# Patient Record
Sex: Male | Born: 2000 | Race: Black or African American | Hispanic: No | Marital: Single | Smoking: Never smoker
Health system: Southern US, Community
[De-identification: ages and names within clinical notes are randomized; demographics above are authoritative.]

## PROBLEM LIST (undated history)

## (undated) DIAGNOSIS — Q431 Hirschsprung's disease: Secondary | ICD-10-CM

## (undated) DIAGNOSIS — E739 Lactose intolerance, unspecified: Secondary | ICD-10-CM

## (undated) DIAGNOSIS — K5900 Constipation, unspecified: Secondary | ICD-10-CM

## (undated) DIAGNOSIS — J40 Bronchitis, not specified as acute or chronic: Secondary | ICD-10-CM

## (undated) HISTORY — PX: HERNIA REPAIR: SHX51

## (undated) HISTORY — PX: LAPAROSCOPIC ENDO-RECTAL PULL THROUGH FOR HIRSCHSPRUNG'S DISEASE: SHX1923

---
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
## 2019-10-10 ENCOUNTER — Other Ambulatory Visit: Payer: Self-pay

## 2019-10-10 ENCOUNTER — Encounter (HOSPITAL_COMMUNITY): Payer: Self-pay | Admitting: Emergency Medicine

## 2019-10-10 ENCOUNTER — Emergency Department (HOSPITAL_COMMUNITY)
Admission: EM | Admit: 2019-10-10 | Discharge: 2019-10-11 | Disposition: A | Discharge status: Home / Self Care | Payer: Medicaid Other | Attending: Emergency Medicine | Admitting: Emergency Medicine

## 2019-10-10 DIAGNOSIS — R1110 Vomiting, unspecified: Secondary | ICD-10-CM | POA: Diagnosis not present

## 2019-10-10 DIAGNOSIS — R1033 Periumbilical pain: Secondary | ICD-10-CM | POA: Diagnosis not present

## 2019-10-10 DIAGNOSIS — Z5321 Procedure and treatment not carried out due to patient leaving prior to being seen by health care provider: Secondary | ICD-10-CM | POA: Diagnosis not present

## 2019-10-10 LAB — COMPREHENSIVE METABOLIC PANEL
ALT: 13 U/L (ref 0–44)
AST: 18 U/L (ref 15–41)
Albumin: 4.4 g/dL (ref 3.5–5.0)
Alkaline Phosphatase: 43 U/L (ref 38–126)
Anion gap: 12 (ref 5–15)
BUN: 12 mg/dL (ref 6–20)
CO2: 25 mmol/L (ref 22–32)
Calcium: 9.1 mg/dL (ref 8.9–10.3)
Chloride: 103 mmol/L (ref 98–111)
Creatinine, Ser: 1.02 mg/dL (ref 0.61–1.24)
GFR calc Af Amer: >60 mL/min (ref >60)
GFR calc non Af Amer: >60 mL/min (ref >60)
Glucose, Bld: 99 mg/dL (ref 70–99)
Potassium: 3.2 mmol/L — ABNORMAL LOW (ref 3.5–5.1)
Sodium: 140 mmol/L (ref 135–145)
Total Bilirubin: 1.2 mg/dL (ref 0.3–1.2)
Total Protein: 7.1 g/dL (ref 6.5–8.1)

## 2019-10-10 LAB — CBC
HCT: 43.6 % (ref 39.0–52.0)
Hemoglobin: 13.5 g/dL (ref 13.0–17.0)
MCH: 24.5 pg — ABNORMAL LOW (ref 26.0–34.0)
MCHC: 31 g/dL (ref 30.0–36.0)
MCV: 79.3 fL — ABNORMAL LOW (ref 80.0–100.0)
Platelets: 316 10*3/uL (ref 150–400)
RBC: 5.5 MIL/uL (ref 4.22–5.81)
RDW: 15.9 % — ABNORMAL HIGH (ref 11.5–15.5)
WBC: 8.2 10*3/uL (ref 4.0–10.5)
nRBC: 0 % (ref 0.0–0.2)

## 2019-10-10 LAB — LIPASE, BLOOD: Lipase: 20 U/L (ref 11–51)

## 2019-10-10 MED ORDER — SODIUM CHLORIDE 0.9% FLUSH: 3.0000 mL | Freq: Once | INTRAVENOUS | Status: DC

## 2019-10-10 NOTE — ED Triage Notes (Signed)
Patient reports mid abdominal pain with emesis onset yesterday , no diarrhea or fever .

## 2019-10-10 NOTE — ED Notes (Signed)
Unable to find pt in waiting room to update VS.

## 2019-10-11 NOTE — ED Notes (Signed)
Pt was called for information 3x and no response. Pt was called for VS 3x and no response. Pt was also called by the back with no response. Pt appears to have left AMA and is no longer in the lobby.

## 2019-10-11 NOTE — ED Notes (Signed)
Pts name was called 3x for VS and no response.

## 2019-11-27 ENCOUNTER — Emergency Department (HOSPITAL_COMMUNITY)
Admission: EM | Admit: 2019-11-27 | Discharge: 2019-11-27 | Discharge status: Against Medical Advice | Payer: Medicaid Other

## 2019-12-01 ENCOUNTER — Emergency Department (HOSPITAL_COMMUNITY)
Admission: EM | Admit: 2019-12-01 | Discharge: 2019-12-02 | Disposition: A | Discharge status: Home / Self Care | Payer: Medicaid Other | Attending: Emergency Medicine | Admitting: Emergency Medicine

## 2019-12-01 ENCOUNTER — Other Ambulatory Visit: Payer: Self-pay

## 2019-12-01 DIAGNOSIS — Z5321 Procedure and treatment not carried out due to patient leaving prior to being seen by health care provider: Secondary | ICD-10-CM | POA: Insufficient documentation

## 2019-12-01 DIAGNOSIS — R6889 Other general symptoms and signs: Secondary | ICD-10-CM | POA: Diagnosis not present

## 2019-12-01 NOTE — ED Notes (Signed)
Called pt for triage, no answer x 1.

## 2019-12-01 NOTE — ED Notes (Signed)
Pt called for triage, no answer x2 

## 2019-12-01 NOTE — ED Notes (Signed)
Called pt for triage, no answer x3 °

## 2021-01-26 ENCOUNTER — Encounter (HOSPITAL_COMMUNITY): Payer: Self-pay | Admitting: Emergency Medicine

## 2021-01-26 ENCOUNTER — Emergency Department (HOSPITAL_COMMUNITY): Payer: Medicaid Other

## 2021-01-26 ENCOUNTER — Other Ambulatory Visit: Payer: Self-pay

## 2021-01-26 ENCOUNTER — Emergency Department (HOSPITAL_COMMUNITY)
Admission: EM | Admit: 2021-01-26 | Discharge: 2021-01-27 | Disposition: A | Discharge status: Home / Self Care | Payer: Medicaid Other | Attending: Emergency Medicine | Admitting: Emergency Medicine

## 2021-01-26 DIAGNOSIS — R1012 Left upper quadrant pain: Secondary | ICD-10-CM | POA: Diagnosis present

## 2021-01-26 DIAGNOSIS — J45909 Unspecified asthma, uncomplicated: Secondary | ICD-10-CM | POA: Diagnosis not present

## 2021-01-26 DIAGNOSIS — F17200 Nicotine dependence, unspecified, uncomplicated: Secondary | ICD-10-CM | POA: Diagnosis not present

## 2021-01-26 DIAGNOSIS — K5909 Other constipation: Secondary | ICD-10-CM | POA: Diagnosis not present

## 2021-01-26 DIAGNOSIS — R112 Nausea with vomiting, unspecified: Secondary | ICD-10-CM | POA: Insufficient documentation

## 2021-01-26 LAB — CBC
HCT: 48.2 % (ref 39.0–52.0)
Hemoglobin: 16.3 g/dL (ref 13.0–17.0)
MCH: 28.1 pg (ref 26.0–34.0)
MCHC: 33.8 g/dL (ref 30.0–36.0)
MCV: 83.1 fL (ref 80.0–100.0)
Platelets: 290 10*3/uL (ref 150–400)
RBC: 5.8 MIL/uL (ref 4.22–5.81)
RDW: 15.1 % (ref 11.5–15.5)
WBC: 10.4 10*3/uL (ref 4.0–10.5)
nRBC: 0 % (ref 0.0–0.2)

## 2021-01-26 LAB — COMPREHENSIVE METABOLIC PANEL
ALT: 13 U/L (ref 0–44)
AST: 18 U/L (ref 15–41)
Albumin: 4.9 g/dL (ref 3.5–5.0)
Alkaline Phosphatase: 44 U/L (ref 38–126)
Anion gap: 12 (ref 5–15)
BUN: 20 mg/dL (ref 6–20)
CO2: 19 mmol/L — ABNORMAL LOW (ref 22–32)
Calcium: 9.4 mg/dL (ref 8.9–10.3)
Chloride: 104 mmol/L (ref 98–111)
Creatinine, Ser: 1.06 mg/dL (ref 0.61–1.24)
GFR, Estimated: >60 mL/min (ref >60)
Glucose, Bld: 119 mg/dL — ABNORMAL HIGH (ref 70–99)
Potassium: 3.9 mmol/L (ref 3.5–5.1)
Sodium: 135 mmol/L (ref 135–145)
Total Bilirubin: 1.2 mg/dL (ref 0.3–1.2)
Total Protein: 8.3 g/dL — ABNORMAL HIGH (ref 6.5–8.1)

## 2021-01-26 LAB — LIPASE, BLOOD: Lipase: 22 U/L (ref 11–51)

## 2021-01-26 NOTE — ED Triage Notes (Signed)
Patient here from home reporting n/v and constipation.Laxative with no relief. Hx of Hirschsprung's.

## 2021-01-26 NOTE — ED Provider Notes (Signed)
[facility]-EMERGENCY DEPT Provider Note: Lowella Dell, MD, FACEP  CSN: [id number] MRN: [id number] ARRIVAL: 01/26/21 at 2216 ROOM: WA22/WA22   CHIEF COMPLAINT  Abdominal Pain   HISTORY OF PRESENT ILLNESS  01/26/21 10:50 PM Johnny Rangel is a 20 y.o. male status post endorectal pull-through for Hirschsprung's disease.  He states he usually has a bowel movement about once a week and often has to take MiraLAX to facilitate this.  He has not had MiraLAX in about 2 weeks.  He had some nausea and vomiting this morning which was not severe and was not persistent.  He also is having pain in his left upper quadrant.  Although he reported to the triage nurse that his pain was a 10 out of 10, and aching in nature, he tells me his pain is about a 5 out of 10.  It is not worse with palpation or movement.  There is no associated abdominal distention.   Past Medical History:  Diagnosis Date   Asthma    per mother no medications at this time, "grew out of it"   Bronchitis    Constipation    last hospital visit for this was 3-4 years ago, received laxatives then to home   Hirschsprung's disease    Lactose intolerance     Past Surgical History:  Procedure Laterality Date   HERNIA REPAIR     LAPAROSCOPIC ENDO-RECTAL PULL THROUGH FOR HIRSCHSPRUNG'S DISEASE      Family History  Problem Relation Age of Onset   Kidney Stones Mother     Social History   Tobacco Use   Smoking status: Some Days    Pack years: 0.00   Smokeless tobacco: Never  Vaping Use   Vaping Use: Never used  Substance Use Topics   Alcohol use: No   Drug use: No    Prior to Admission medications   Medication Sig Start Date End Date Taking? Authorizing Provider  albuterol (PROVENTIL HFA;VENTOLIN HFA) 108 (90 Base) MCG/ACT inhaler Inhale 1-2 puffs into the lungs every 6 (six) hours as needed for wheezing or shortness of breath. 09/26/18  Yes Terrilee Files, MD  polyethylene glycol (MIRALAX / GLYCOLAX) 17 g packet Take 17  g by mouth daily. 12/29/18  Yes Niel Hummer, MD    Allergies Milk-related compounds, Vancomycin, and Whey   REVIEW OF SYSTEMS  Negative except as noted here or in the History of Present Illness.   PHYSICAL EXAMINATION  Initial Vital Signs Blood pressure 113/82, pulse 93, temperature 99.2 F (37.3 C), resp. rate 18, SpO2 100 %.  Examination General: Well-developed, thin male in no acute distress; appearance consistent with age of record HENT: normocephalic; atraumatic Eyes: pupils equal, round and reactive to light; extraocular muscles intact Neck: supple Heart: regular rate and rhythm Lungs: clear to auscultation bilaterally Abdomen: soft; scaphoid; nontender; bowel sounds present Extremities: No deformity; full range of motion; pulses normal Neurologic: Awake, alert and oriented; motor function intact in all extremities and symmetric; no facial droop Skin: Warm and dry Psychiatric: Normal mood and affect   RESULTS  Summary of this visit's results, reviewed and interpreted by myself:   EKG Interpretation  Date/Time:    Ventricular Rate:    PR Interval:    QRS Duration:   QT Interval:    QTC Calculation:   R Axis:     Text Interpretation:          Laboratory Studies: Results for orders placed or performed during the hospital encounter of 01/26/21 (  from the past 24 hour(s))  Lipase, blood     Status: None   Collection Time: 01/26/21 10:33 PM  Result Value Ref Range   Lipase 22 11 - 51 U/L  Comprehensive metabolic panel     Status: Abnormal   Collection Time: 01/26/21 10:33 PM  Result Value Ref Range   Sodium 135 135 - 145 mmol/L   Potassium 3.9 3.5 - 5.1 mmol/L   Chloride 104 98 - 111 mmol/L   CO2 19 (L) 22 - 32 mmol/L   Glucose, Bld 119 (H) 70 - 99 mg/dL   BUN 20 6 - 20 mg/dL   Creatinine, Ser 4.40 0.61 - 1.24 mg/dL   Calcium 9.4 8.9 - 10.2 mg/dL   Total Protein 8.3 (H) 6.5 - 8.1 g/dL   Albumin 4.9 3.5 - 5.0 g/dL   AST 18 15 - 41 U/L   ALT 13 0 - 44  U/L   Alkaline Phosphatase 44 38 - 126 U/L   Total Bilirubin 1.2 0.3 - 1.2 mg/dL   GFR, Estimated >72 >53 mL/min   Anion gap 12 5 - 15  CBC     Status: None   Collection Time: 01/26/21 10:33 PM  Result Value Ref Range   WBC 10.4 4.0 - 10.5 K/uL   RBC 5.80 4.22 - 5.81 MIL/uL   Hemoglobin 16.3 13.0 - 17.0 g/dL   HCT 66.4 40.3 - 47.4 %   MCV 83.1 80.0 - 100.0 fL   MCH 28.1 26.0 - 34.0 pg   MCHC 33.8 30.0 - 36.0 g/dL   RDW 25.9 56.3 - 87.5 %   Platelets 290 150 - 400 K/uL   nRBC 0.0 0.0 - 0.2 %   Imaging Studies: DG ABD ACUTE 2+V W 1V CHEST  Result Date: 01/26/2021 CLINICAL DATA:  Left lower quadrant pain, nausea and vomiting, constipation EXAM: DG ABDOMEN ACUTE WITH 1 VIEW CHEST COMPARISON:  12/29/2018 FINDINGS: Supine and upright frontal views of the abdomen and pelvis as well as an upright frontal view of the chest are obtained. Cardiac silhouette is unremarkable. The lungs are clear. There is a large amount of retained stool throughout the colon. No obstruction or ileus. No masses or abnormal calcifications. IMPRESSION: 1. Large amount of retained stool throughout the colon. 2. No bowel obstruction. 3. No acute intrathoracic process. Electronically Signed   By: Sharlet Salina M.D.   On: 01/26/2021 23:29    ED COURSE and MDM  Nursing notes, initial and subsequent vitals signs, including pulse oximetry, reviewed and interpreted by myself.  Vitals:   01/26/21 2223 01/26/21 2353 01/27/21 0015  BP: 113/82 104/68 102/80  Pulse: 93 84 63  Resp: 18 18 18   Temp: 99.2 F (37.3 C)    SpO2: 100% 97% 97%   Medications  magnesium citrate solution 1 Bottle (has no administration in time range)    Will give patient a dose of magnesium citrate for his constipation.  The cause of his pain is unclear but likely due to constipation.  His abdomen is soft and nontender.   PROCEDURES  Procedures   ED DIAGNOSES     ICD-10-CM   1. Chronic constipation  K59.09          Johnny Rangel, ,  MD 01/27/21 [phone]

## 2021-01-27 MED ORDER — MAGNESIUM CITRATE PO SOLN
1.0000 | Freq: Once | ORAL | Status: DC
  Filled 2021-01-27: qty 296

## 2021-01-27 NOTE — ED Notes (Signed)
Provided pt with water.

## 2021-01-27 NOTE — ED Notes (Signed)
Pt attempted to go to the bathroom and pee. Pt exited the bathroom with an empty cup and said he was unable to pee.

## 2021-06-01 ENCOUNTER — Ambulatory Visit (HOSPITAL_COMMUNITY): Payer: Medicaid Other

## 2022-01-29 ENCOUNTER — Ambulatory Visit (HOSPITAL_COMMUNITY)
Admission: RE | Admit: 2022-01-29 | Discharge: 2022-01-29 | Disposition: A | Discharge status: Home / Self Care | Payer: Medicaid Other | Source: Ambulatory Visit | Attending: Physician Assistant | Admitting: Physician Assistant

## 2022-01-29 ENCOUNTER — Encounter (HOSPITAL_COMMUNITY): Payer: Self-pay

## 2022-01-29 VITALS — BP 129/80 | HR 98 | Temp 98.1°F | Resp 18

## 2022-01-29 DIAGNOSIS — N480 Leukoplakia of penis: Secondary | ICD-10-CM | POA: Diagnosis not present

## 2022-01-29 DIAGNOSIS — R21 Rash and other nonspecific skin eruption: Secondary | ICD-10-CM

## 2022-01-29 MED ORDER — CLOBETASOL PROPIONATE 0.05 % EX OINT: 1.0000 | TOPICAL_OINTMENT | Freq: Two times a day (BID) | CUTANEOUS | 0 refills | Status: DC

## 2022-01-29 NOTE — ED Provider Notes (Signed)
[facility]    CSN: [id number] Arrival date & time: 01/29/22  1615      History   Chief Complaint Chief Complaint  Patient presents with   Rash    HPI Johnny Rangel is a 21 y.o. male.   21 year old male presents with rash on penis.  Patient relates that he has noticed a rash on the end of the shaft of the penis just behind the penile head.  He relates that these are bumps that have occurred, they are not painful, and they are not red.  Patient relates that there is no discomfort he just noticed them being there.  Patient relates he is not having any unusual swelling of the penis, no unusual ulcerations, no penile discharge.  Patient relates that he is not having any burning on urination.  Patient does relate that the last time he had intercourse was about 3 weeks ago, and this was protected.  Patient denies fever or chills.   Rash   Past Medical History:  Diagnosis Date   Asthma    per mother no medications at this time, "grew out of it"   Bronchitis    Constipation    last hospital visit for this was 3-4 years ago, received laxatives then to home   Hirschsprung's disease    Lactose intolerance     Patient Active Problem List   Diagnosis Date Noted   Abdominal distension (gaseous) 01/03/2016   Hirschsprung disease 01/03/2016   Colonic obstruction (HCC)    Enterocolitis     Past Surgical History:  Procedure Laterality Date   HERNIA REPAIR     LAPAROSCOPIC ENDO-RECTAL PULL THROUGH FOR HIRSCHSPRUNG'S DISEASE         Home Medications    Prior to Admission medications   Medication Sig Start Date End Date Taking? Authorizing Provider  clobetasol ointment (TEMOVATE) 0.05 % Apply 1 Application topically 2 (two) times daily. 01/29/22  Yes Ellsworth Lennox, PA-C  albuterol (PROVENTIL HFA;VENTOLIN HFA) 108 (90 Base) MCG/ACT inhaler Inhale 1-2 puffs into the lungs every 6 (six) hours as needed for wheezing or shortness of breath. 09/26/18   Terrilee Files, MD     Family History Family History  Problem Relation Age of Onset   Kidney Stones Mother     Social History Social History   Tobacco Use   Smoking status: Never   Smokeless tobacco: Never  Vaping Use   Vaping Use: Never used  Substance Use Topics   Alcohol use: No   Drug use: No     Allergies   Milk-related compounds, Vancomycin, and Whey   Review of Systems Review of Systems  Skin:  Positive for rash (penile shaft).     Physical Exam Triage Vital Signs ED Triage Vitals [01/29/22 1645]  Enc Vitals Group     BP 129/80     Pulse Rate 98     Resp 18     Temp 98.1 F (36.7 C)     Temp Source Oral     SpO2 98 %     Weight      Height      Head Circumference      Peak Flow      Pain Score 0     Pain Loc      Pain Edu?      Excl. in [place]?    No data found.  Updated Vital Signs BP 129/80 (BP Location: Left Arm)   Pulse 98   Temp  98.1 F (36.7 C) (Oral)   Resp 18   SpO2 98%   Visual Acuity Right Eye Distance:   Left Eye Distance:   Bilateral Distance:    Right Eye Near:   Left Eye Near:    Bilateral Near:     Physical Exam Constitutional:      Appearance: Normal appearance.  Genitourinary:      Comments: Genitals: The distal portion of the anterior penile shaft just behind the glans has a 0.5 cm x 0.25 cm area of hypopigmentation with some mild flat papules present.  There is no redness, or swelling.  There is no penile discharge.  There is no scrotal swelling. Neurological:     Mental Status: He is alert.      UC Treatments / Results  Labs (all labs ordered are listed, but only abnormal results are displayed) Labs Reviewed - No data to display  EKG   Radiology No results found.  Procedures Procedures (including critical care time)  Medications Ordered in UC Medications - No data to display  Initial Impression / Assessment and Plan / UC Course  I have reviewed the triage vital signs and the nursing notes.  Pertinent labs &  imaging results that were available during my care of the patient were reviewed by me and considered in my medical decision making (see chart for details).    Plan: 1.  Patient advised to use the clobetasol cream, apply to the area lightly twice a day for the next 2 weeks. 2.  Advised to follow-up with PCP or return to urgent care if symptoms fail to improve. Final Clinical Impressions(s) / UC Diagnoses   Final diagnoses:  Rash  Lichen sclerosus of penis     Discharge Instructions      Advised to apply the clobetasol lightly to the area twice daily for the next 10 to 14 days. Advised to observe and follow-up with PCP or return if symptoms fail to improve.    ED Prescriptions     Medication Sig Dispense Auth. Provider   clobetasol ointment (TEMOVATE) 0.05 % Apply 1 Application topically 2 (two) times daily. 15 g Ellsworth Lennox, PA-C      PDMP not reviewed this encounter.   Ellsworth Lennox, PA-C 01/29/22 1703

## 2022-01-29 NOTE — Discharge Instructions (Signed)
Advised to apply the clobetasol lightly to the area twice daily for the next 10 to 14 days. Advised to observe and follow-up with PCP or return if symptoms fail to improve.

## 2022-01-29 NOTE — ED Triage Notes (Signed)
Pt c/o two bumps to top side of penis x2 days. Denies itching or pain. Denies having unprotected intercourse.

## 2022-09-11 ENCOUNTER — Encounter (HOSPITAL_COMMUNITY): Payer: Self-pay

## 2022-09-11 ENCOUNTER — Ambulatory Visit (HOSPITAL_COMMUNITY)
Admission: RE | Admit: 2022-09-11 | Discharge: 2022-09-11 | Disposition: A | Discharge status: Home / Self Care | Payer: Medicaid Other | Source: Ambulatory Visit | Attending: Family Medicine | Admitting: Family Medicine

## 2022-09-11 VITALS — BP 118/77 | HR 87 | Temp 97.7°F | Resp 16

## 2022-09-11 DIAGNOSIS — Z113 Encounter for screening for infections with a predominantly sexual mode of transmission: Secondary | ICD-10-CM | POA: Insufficient documentation

## 2022-09-11 DIAGNOSIS — Z202 Contact with and (suspected) exposure to infections with a predominantly sexual mode of transmission: Secondary | ICD-10-CM

## 2022-09-11 NOTE — Discharge Instructions (Addendum)
You were swabbed today for STDs.  This will be resulted tomorrow.  You can see these results on mychart, but you also be notified if anything is positive requiring treatment.

## 2022-09-11 NOTE — ED Provider Notes (Signed)
[facility]    CSN: [id number] Arrival date & time: 09/11/22  0935      History   Chief Complaint Chief Complaint  Patient presents with   Follow-up    Check up on everything such as chlamydia etc - Entered by patient   std testing    HPI Johnny Rangel is a 22 y.o. male.   Patient is here for std screening.  He is sexually active with 1 partner. No symptoms noted.  No known exposures.  He is not sure of last screening.  He declines lab work today, only wants the swab.        Past Medical History:  Diagnosis Date   Asthma    per mother no medications at this time, "grew out of it"   Bronchitis    Constipation    last hospital visit for this was 3-4 years ago, received laxatives then to home   Hirschsprung's disease    Lactose intolerance     Patient Active Problem List   Diagnosis Date Noted   Abdominal distension (gaseous) 01/03/2016   Hirschsprung disease 01/03/2016   Colonic obstruction ([place])    Enterocolitis     Past Surgical History:  Procedure Laterality Date   HERNIA REPAIR     LAPAROSCOPIC ENDO-RECTAL PULL THROUGH FOR HIRSCHSPRUNG'S DISEASE         Home Medications    Prior to Admission medications   Not on File    Family History Family History  Problem Relation Age of Onset   Kidney Stones Mother     Social History Social History   Tobacco Use   Smoking status: Never   Smokeless tobacco: Never  Vaping Use   Vaping Use: Never used  Substance Use Topics   Alcohol use: No   Drug use: No     Allergies   Milk-related compounds, Vancomycin, and Whey   Review of Systems Review of Systems  Constitutional: Negative.   HENT: Negative.    Respiratory: Negative.    Cardiovascular: Negative.   Gastrointestinal: Negative.   Genitourinary: Negative.   Musculoskeletal: Negative.   Psychiatric/Behavioral: Negative.       Physical Exam Triage Vital Signs ED Triage Vitals  Enc Vitals Group     BP 09/11/22 1004  118/77     Pulse Rate 09/11/22 1004 87     Resp 09/11/22 1004 16     Temp 09/11/22 1004 97.7 F (36.5 C)     Temp Source 09/11/22 1004 Oral     SpO2 09/11/22 1004 97 %     Weight --      Height --      Head Circumference --      Peak Flow --      Pain Score 09/11/22 1005 0     Pain Loc --      Pain Edu? --      Excl. in [place]? --    No data found.  Updated Vital Signs BP 118/77 (BP Location: Right Arm)   Pulse 87   Temp 97.7 F (36.5 C) (Oral)   Resp 16   SpO2 97%   Visual Acuity Right Eye Distance:   Left Eye Distance:   Bilateral Distance:    Right Eye Near:   Left Eye Near:    Bilateral Near:     Physical Exam Constitutional:      Appearance: Normal appearance.  Cardiovascular:     Rate and Rhythm: Normal rate.  Pulmonary:  Effort: Pulmonary effort is normal.  Musculoskeletal:     Cervical back: Normal range of motion.  Skin:    General: Skin is warm.  Neurological:     General: No focal deficit present.     Mental Status: He is alert.  Psychiatric:        Mood and Affect: Mood normal.      UC Treatments / Results  Labs (all labs ordered are listed, but only abnormal results are displayed) Labs Reviewed  CYTOLOGY, (ORAL, ANAL, URETHRAL) ANCILLARY ONLY    EKG   Radiology No results found.  Procedures Procedures (including critical care time)  Medications Ordered in UC Medications - No data to display  Initial Impression / Assessment and Plan / UC Course  I have reviewed the triage vital signs and the nursing notes.  Pertinent labs & imaging results that were available during my care of the patient were reviewed by me and considered in my medical decision making (see chart for details).    Final Clinical Impressions(s) / UC Diagnoses   Final diagnoses:  Screening for STD (sexually transmitted disease)     Discharge Instructions      You were swabbed today for STDs.  This will be resulted tomorrow.  You can see these results on  mychart, but you also be notified if anything is positive requiring treatment.      ED Prescriptions   None    PDMP not reviewed this encounter.   Rondel Oh, MD 09/11/22 1013

## 2022-09-11 NOTE — ED Triage Notes (Signed)
Pt states he is here for STD testing denies any symptoms. 

## 2022-09-12 ENCOUNTER — Telehealth (HOSPITAL_COMMUNITY): Payer: Self-pay | Admitting: Emergency Medicine

## 2022-09-12 ENCOUNTER — Ambulatory Visit (HOSPITAL_COMMUNITY)
Admission: EM | Admit: 2022-09-12 | Discharge: 2022-09-12 | Disposition: A | Discharge status: Home / Self Care | Payer: Self-pay | Attending: Family Medicine | Admitting: Family Medicine

## 2022-09-12 DIAGNOSIS — A549 Gonococcal infection, unspecified: Secondary | ICD-10-CM

## 2022-09-12 LAB — CYTOLOGY, (ORAL, ANAL, URETHRAL) ANCILLARY ONLY
Chlamydia: POSITIVE — AB
Comment: NEGATIVE
Comment: NEGATIVE
Comment: NORMAL
Neisseria Gonorrhea: POSITIVE — AB
Trichomonas: NEGATIVE

## 2022-09-12 MED ORDER — DOXYCYCLINE HYCLATE 100 MG PO CAPS: 100.0000 mg | ORAL_CAPSULE | Freq: Two times a day (BID) | ORAL | 0 refills | Status: AC

## 2022-09-12 MED ORDER — LIDOCAINE HCL (PF) 1 % IJ SOLN
INTRAMUSCULAR | Status: AC
  Filled 2022-09-12: qty 2

## 2022-09-12 MED ORDER — CEFTRIAXONE SODIUM 500 MG IJ SOLR
INTRAMUSCULAR | Status: AC
  Filled 2022-09-12: qty 500

## 2022-09-12 MED ORDER — CEFTRIAXONE SODIUM 500 MG IJ SOLR
500.0000 mg | INTRAMUSCULAR | Status: DC
  Administered 2022-09-12: 500 mg via INTRAMUSCULAR

## 2022-09-12 NOTE — Telephone Encounter (Signed)
Per protocol, patient will need treatment with IM Rocephin 500mg  for positive Gonorrhea.   Will also need treatment with Doxycycline. Prescription sent to pharmacy on file

## 2022-09-12 NOTE — ED Triage Notes (Signed)
Pt in office today for STI treatment.

## 2023-02-09 ENCOUNTER — Emergency Department (HOSPITAL_COMMUNITY)
Admission: EM | Admit: 2023-02-09 | Discharge: 2023-02-09 | Discharge status: Against Medical Advice | Payer: Self-pay | Attending: Emergency Medicine | Admitting: Emergency Medicine

## 2023-02-09 DIAGNOSIS — R258 Other abnormal involuntary movements: Secondary | ICD-10-CM | POA: Insufficient documentation

## 2023-02-09 DIAGNOSIS — R1110 Vomiting, unspecified: Secondary | ICD-10-CM | POA: Insufficient documentation

## 2023-02-09 DIAGNOSIS — Z5321 Procedure and treatment not carried out due to patient leaving prior to being seen by health care provider: Secondary | ICD-10-CM | POA: Insufficient documentation

## 2023-02-09 DIAGNOSIS — R0600 Dyspnea, unspecified: Secondary | ICD-10-CM | POA: Insufficient documentation

## 2023-02-09 NOTE — ED Notes (Signed)
Pt seen walking out of ED with family 

## 2023-02-09 NOTE — ED Notes (Signed)
Called for triage no answer x 1 .

## 2023-05-12 ENCOUNTER — Other Ambulatory Visit: Payer: Self-pay

## 2023-05-12 ENCOUNTER — Emergency Department (HOSPITAL_COMMUNITY)
Admission: EM | Admit: 2023-05-12 | Discharge: 2023-05-12 | Disposition: A | Discharge status: Home / Self Care | Payer: Self-pay | Attending: Emergency Medicine | Admitting: Emergency Medicine

## 2023-05-12 ENCOUNTER — Encounter (HOSPITAL_COMMUNITY): Payer: Self-pay

## 2023-05-12 DIAGNOSIS — K0889 Other specified disorders of teeth and supporting structures: Secondary | ICD-10-CM | POA: Insufficient documentation

## 2023-05-12 MED ORDER — KETOROLAC TROMETHAMINE 30 MG/ML IJ SOLN
30.0000 mg | Freq: Once | INTRAMUSCULAR | Status: AC
  Administered 2023-05-12: 30 mg via INTRAMUSCULAR
  Filled 2023-05-12: qty 1

## 2023-05-12 NOTE — ED Triage Notes (Signed)
Pt c/o right sided teeth painx2d.

## 2023-05-12 NOTE — Discharge Instructions (Addendum)
Take Tylenol or ibuprofen as needed for pain   Provided resources for dentist to patient and discharge summary.    Return to emergency department to include but not limited to difficulty opening jaw wide, difficulty swallowing, deviation of uvula, abscess formation, worsening of symptoms.

## 2023-05-12 NOTE — ED Provider Notes (Signed)
[place] EM[facility]GENC[facility]MENT AT [facility] Provider Note   CSN: [id number] Arrival date & time: 05/12/23  7829     History  Chief Complaint  Patient presents with   Dental Pain    Johnny Rangel is a 22 y.o. male with no significant past medical history presents emergency department for evaluation of right upper jaw pain that started on Saturday.  He reports that pain is worsened with chewing and opening mouth wide.  He reports that he has not seen a dentist for this issue.  He denies fevers, abscesses, swelling.   Dental Pain Associated symptoms: no fever       Home Medications Prior to Admission medications   Not on File      Allergies    Milk-related compounds, Vancomycin, and Whey    Review of Systems   Review of Systems  Constitutional:  Negative for fever.  HENT:  Positive for dental problem.   Respiratory:  Negative for cough and shortness of breath.   Cardiovascular:  Negative for chest pain.    Physical Exam Updated Vital Signs BP 105/68 (BP Location: Right Arm)   Pulse 78   Temp 97.7 F (36.5 C)   Resp 17   Ht 5\' 7"  (1.702 m)   Wt 60 kg   SpO2 100%   BMI 20.72 kg/m  Physical Exam Vitals and nursing note reviewed.  Constitutional:      General: He is not in acute distress.    Appearance: Normal appearance.  HENT:     Head: Normocephalic and atraumatic.     Nose: Nose normal. No congestion or rhinorrhea.     Mouth/Throat:     Mouth: Mucous membranes are moist.     Pharynx: No oropharyngeal exudate or posterior oropharyngeal erythema.     Comments: Eruption of tooth behind molars on right upper side No abscess, swelling, erythema, hemorrhage noted to hard or soft palate Uvula midline Able to open mouth wide without difficulty Eyes:     Conjunctiva/sclera: Conjunctivae normal.  Cardiovascular:     Rate and Rhythm: Normal rate.  Pulmonary:     Effort: Pulmonary effort is normal. No respiratory distress.     Breath sounds:  Normal breath sounds.  Musculoskeletal:     Cervical back: Normal range of motion and neck supple. No rigidity or tenderness.  Lymphadenopathy:     Cervical: No cervical adenopathy.  Skin:    Coloration: Skin is not jaundiced or pale.  Neurological:     Mental Status: He is alert. Mental status is at baseline.    ED Results / Procedures / Treatments   Labs (all labs ordered are listed, but only abnormal results are displayed) Labs Reviewed - No data to display  EKG None  Radiology No results found.  Procedures Procedures    Medications Ordered in ED Medications  ketorolac (TORADOL) 30 MG/ML injection 30 mg (30 mg Intramuscular Given 05/12/23 5621)    ED Course/ Medical Decision Making/ A&P                                 Medical Decision Making Risk Prescription drug management.   Patient presents to the ED for concern of right upper dental pain, this involves an extensive number of treatment options, and is a complaint that carries with it a high risk of complications and morbidity.  The differential diagnosis includes large of wisdom tooth, infection, crowding  of teeth, trismus   Co morbidities that complicate the patient evaluation  None   Additional history obtained:  External records from outside source obtained upon chart review   Lab Tests:  I do not feel that lab work is required for treatment or disposition plan.  Patient does not appear to be septic with vital signs WNL.  Imaging Studies ordered:  I do not feel that imaging is required for treatment or disposition plan.  No abscess, fluctuance, erythema noted to the soft palate.  Patient is able to open mouth fully without difficulty.   Medicines ordered and prescription drug management:  I ordered medication including Toradol for pain management Reevaluation of the patient after these medicines showed that the patient improved I have reviewed the patients home medicines and have made  adjustments as needed   Problem List / ED Course:  Dental pain   Reevaluation:  After the interventions noted above, I reevaluated the patient and found that they have :improved   Dispostion:  Upon evaluation, patient is resting comfortably in bed.  No physical signs of distress and not ill-appearing.  Vital signs WNL.  See HPI.  Upon physical exam, there is an emergence of 1 tooth behind more on right upper jaw.  Patient complains of pain with mastication and opening jaw.  No abscess, fluctuance, erythema noted to soft or hard palate to indicate infection.  Patient is able to fully open jaw wide making low suspicion for trismus.  Toradol given for pain management.  Patient reports improvement of pain.  After consideration of the diagnostic results and the patients response to treatment, I feel that the patent would benefit from outpatient follow-up with dentist.  Discussed physical exam findings and treatment plan with patient who expresses understanding and agrees with plan.  Patient is to take Tylenol or ibuprofen as needed for pain until he can get to dentist.  Provided resources for dentist to patient and discharge summary.  Questions answered.  Return to emergency department precautions explained to patient to include but not limited to difficulty opening jaw wide, difficulty swallowing, deviation of uvula, abscess formation, worsening of symptoms.         Final Clinical Impression(s) / ED Diagnoses Final diagnoses:  Pain, dental    Rx / DC Orders ED Discharge Orders     None         Judithann Sheen, PA 05/12/23 1015    Tegeler, Canary Brim, MD 05/12/23 [phone]

## 2023-06-17 IMAGING — CR DG ABDOMEN ACUTE W/ 1V CHEST
4 series · 4 of 4 positions shown · non-contrast
Comparison: 12/29/2018

CLINICAL DATA: Left lower quadrant pain, nausea and vomiting,
constipation

EXAM:
DG ABDOMEN ACUTE WITH 1 VIEW CHEST

[w chest pa]
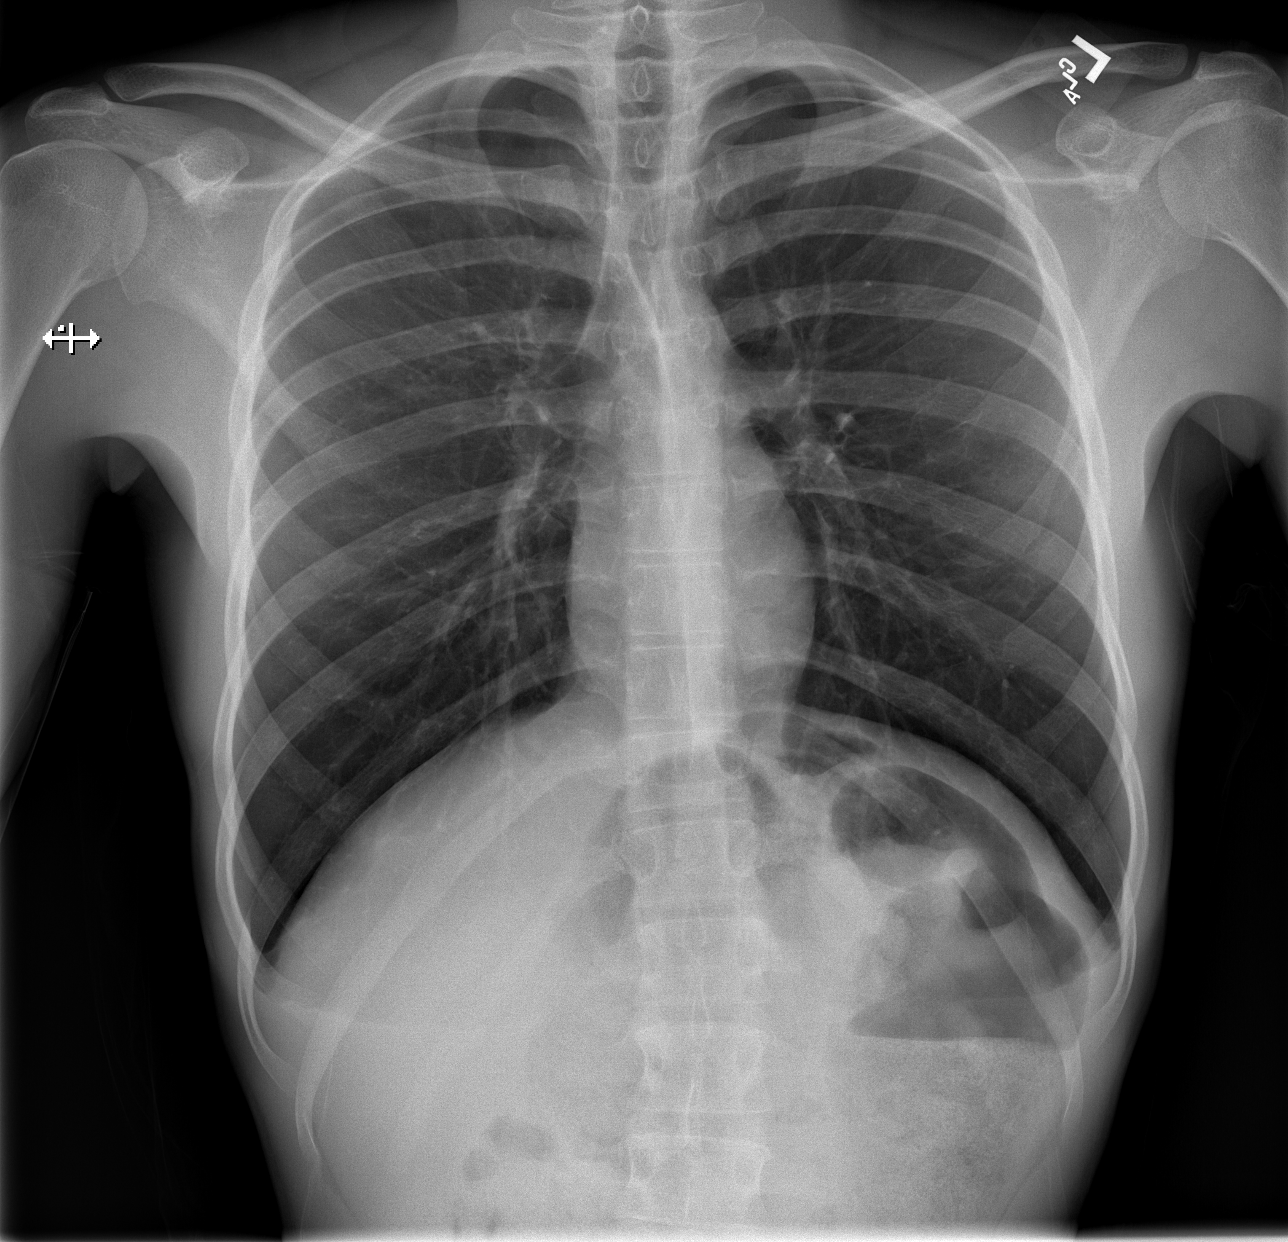

[w abdomen upright (1 of 2)]
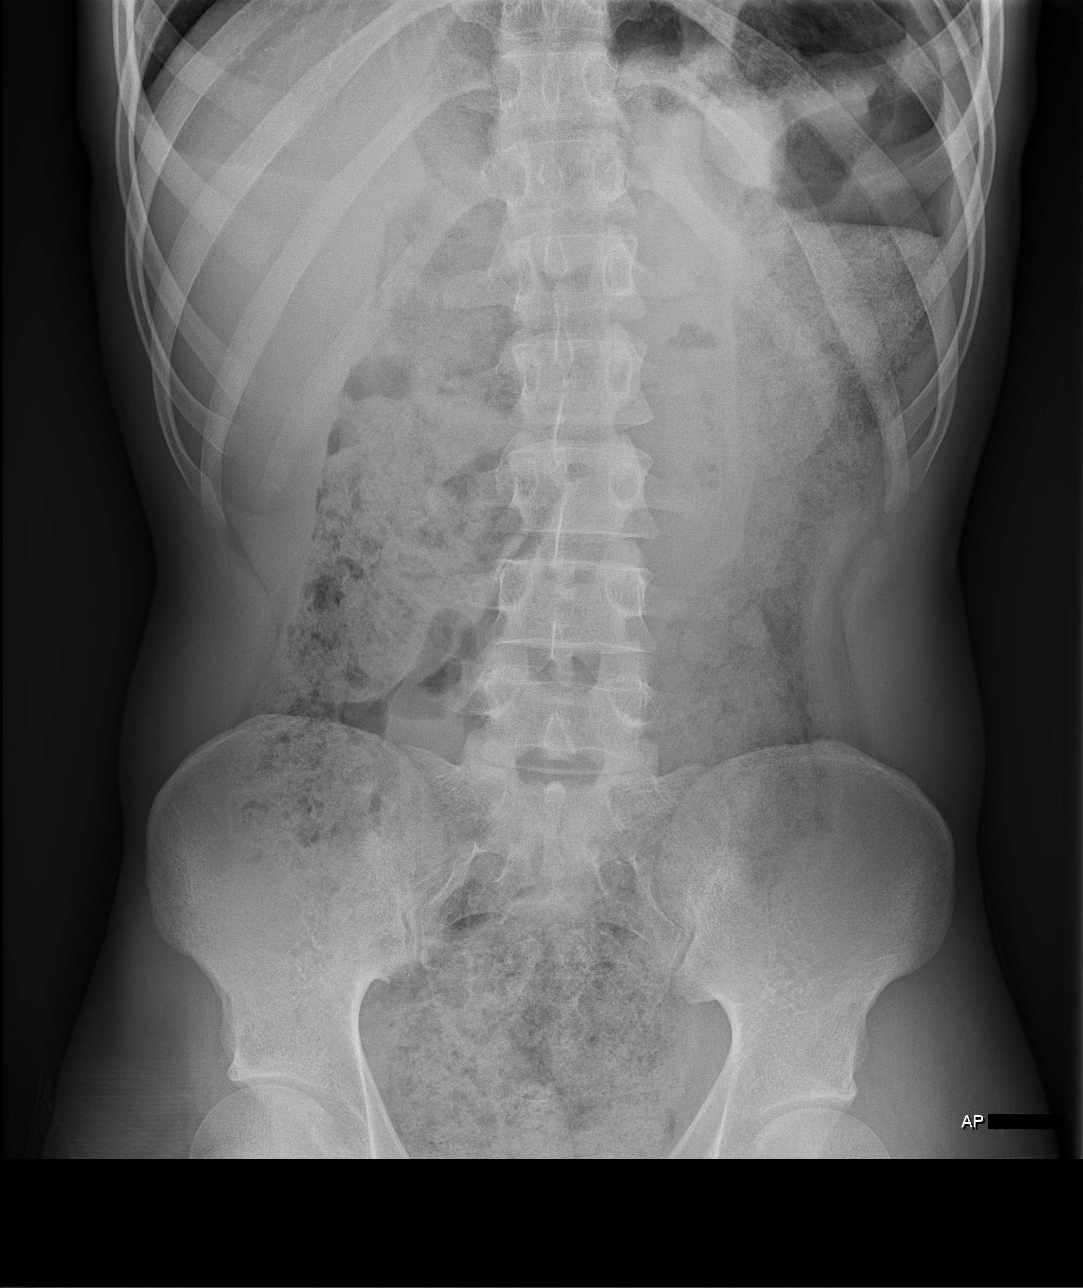

[w abdomen upright (2 of 2)]
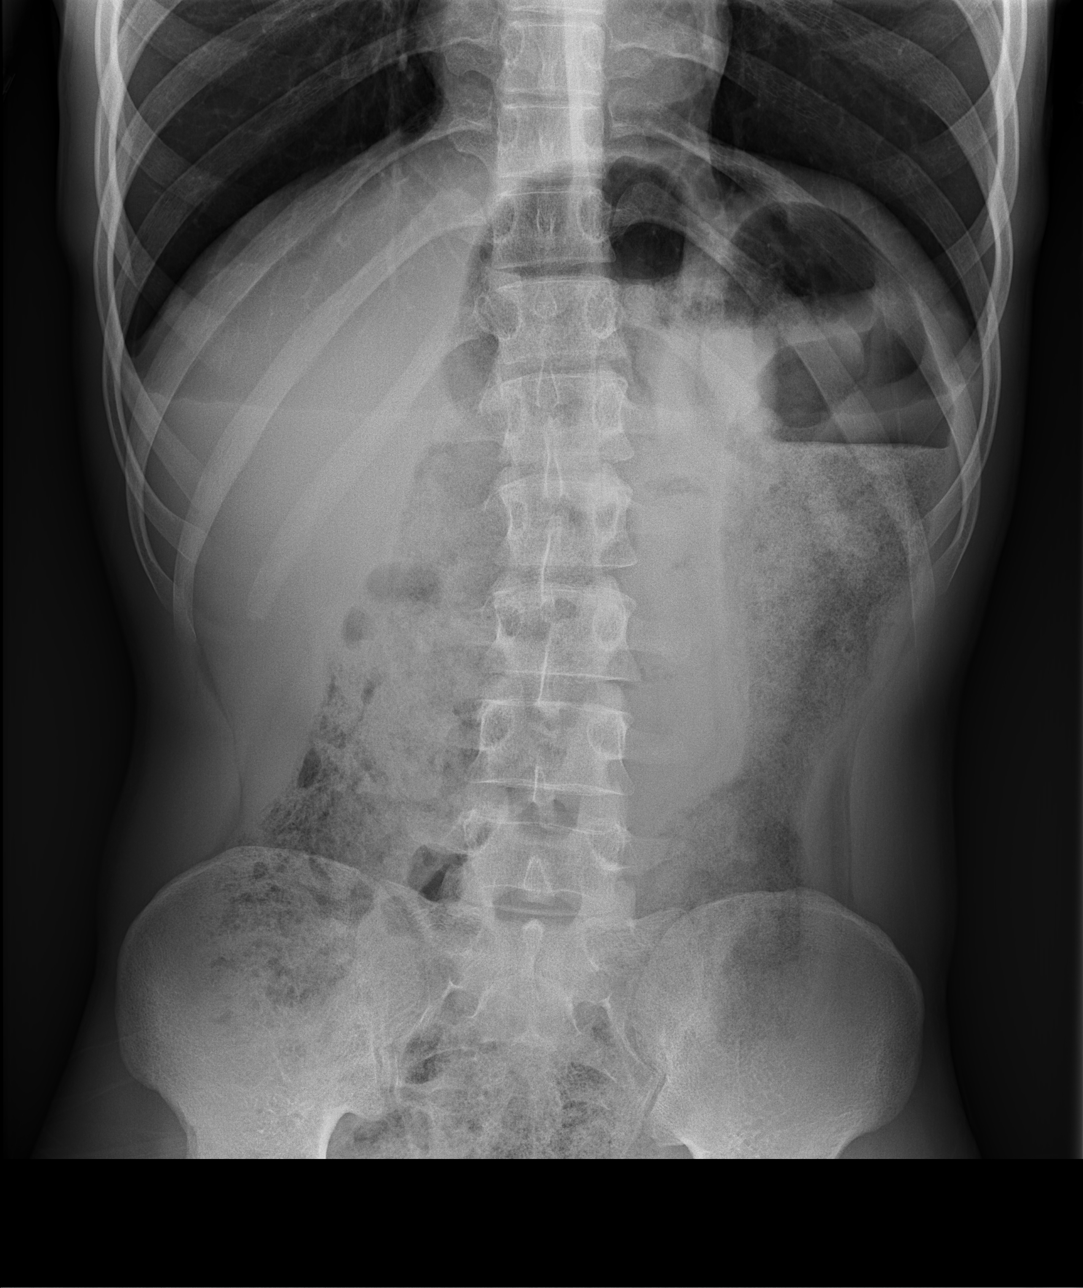

[t abdomen supine]
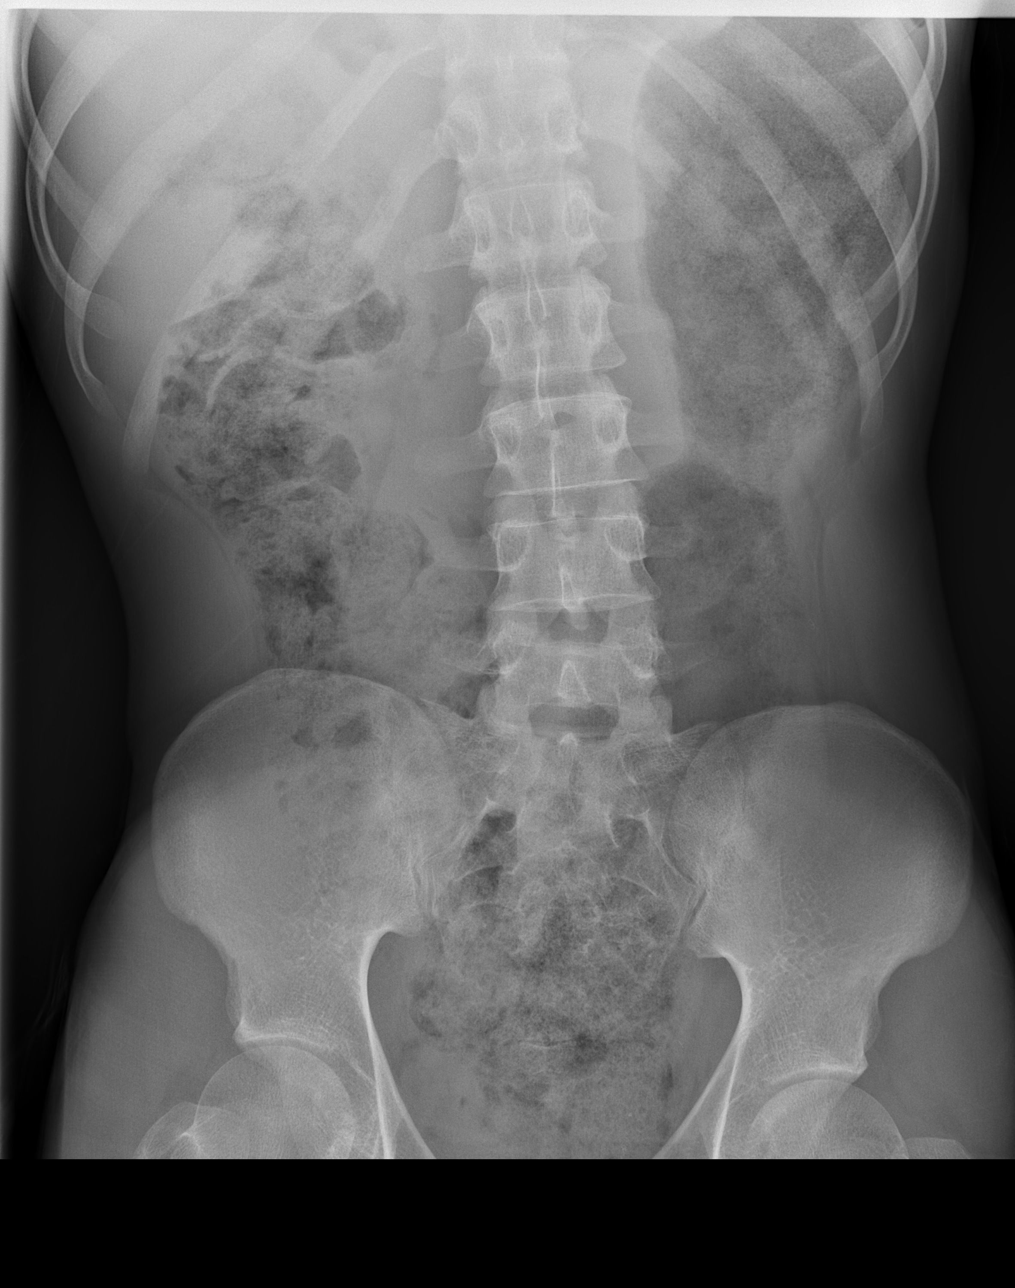

[4 of 4 positions shown; findings below may reference images not displayed]

FINDINGS: Supine and upright frontal views of the abdomen and pelvis as well
as an upright frontal view of the chest are obtained. Cardiac
silhouette is unremarkable. The lungs are clear.

There is a large amount of retained stool throughout the colon. No
obstruction or ileus. No masses or abnormal calcifications.
IMPRESSION: 1. Large amount of retained stool throughout the colon.
2. No bowel obstruction.
3. No acute intrathoracic process.
